# Patient Record
Sex: Female | Born: 1995 | Race: Black or African American | Hispanic: No | Marital: Single | Smoking: Never smoker
Health system: Southern US, Community
[De-identification: ages and names within clinical notes are randomized; demographics above are authoritative.]

## PROBLEM LIST (undated history)

## (undated) ENCOUNTER — Inpatient Hospital Stay (HOSPITAL_COMMUNITY): Payer: Self-pay

## (undated) DIAGNOSIS — Z789 Other specified health status: Secondary | ICD-10-CM

## (undated) HISTORY — PX: NO PAST SURGERIES: SHX2092

---

## 2016-11-11 ENCOUNTER — Emergency Department (HOSPITAL_BASED_OUTPATIENT_CLINIC_OR_DEPARTMENT_OTHER)
Admission: EM | Admit: 2016-11-11 | Discharge: 2016-11-11 | Disposition: A | Discharge status: Home / Self Care | Payer: Federal, State, Local not specified - PPO | Attending: Emergency Medicine | Admitting: Emergency Medicine

## 2016-11-11 ENCOUNTER — Encounter (HOSPITAL_BASED_OUTPATIENT_CLINIC_OR_DEPARTMENT_OTHER): Payer: Self-pay

## 2016-11-11 DIAGNOSIS — J029 Acute pharyngitis, unspecified: Secondary | ICD-10-CM | POA: Diagnosis present

## 2016-11-11 DIAGNOSIS — J020 Streptococcal pharyngitis: Secondary | ICD-10-CM | POA: Diagnosis not present

## 2016-11-11 LAB — RAPID STREP SCREEN (MED CTR MEBANE ONLY): STREPTOCOCCUS, GROUP A SCREEN (DIRECT): POSITIVE — AB

## 2016-11-11 MED ORDER — HYDROCODONE-ACETAMINOPHEN 7.5-325 MG/15ML PO SOLN
10.0000 mL | Freq: Once | ORAL | Status: AC
  Administered 2016-11-11: 10 mL via ORAL
  Filled 2016-11-11: qty 15

## 2016-11-11 MED ORDER — DEXAMETHASONE 10 MG/ML FOR PEDIATRIC ORAL USE
10.0000 mg | Freq: Once | INTRAMUSCULAR | Status: AC
  Administered 2016-11-11: 10 mg via ORAL
  Filled 2016-11-11: qty 1

## 2016-11-11 MED ORDER — PENICILLIN G BENZATHINE 1200000 UNIT/2ML IM SUSP
1.2000 10*6.[IU] | Freq: Once | INTRAMUSCULAR | Status: AC
  Administered 2016-11-11: 1.2 10*6.[IU] via INTRAMUSCULAR
  Filled 2016-11-11: qty 2

## 2016-11-11 NOTE — ED Provider Notes (Signed)
   [facility] Provider Note: Lowella DellJ. Lane Kalika Smay, MD, FACEP  CSN: [id number] MRN: [id number] ARRIVAL: 11/11/16 at 0111 ROOM: MH02/MH02   CHIEF COMPLAINT  Sore Throat   HISTORY OF PRESENT ILLNESS  Victoria Boyle is a 21 y.o. female with a one-day history of sore throat. She rates her pain as a 7 out of 10, worse with swallowing. She has had associated nasal congestion and anterior cervical lymphadenopathy. She denies cough, fever, chills, nausea, vomiting, diarrhea or abdominal pain. She has not taken anything for her symptoms.   History reviewed. No pertinent past medical history.  History reviewed. No pertinent surgical history.  No family history on file.  Social History  Substance Use Topics  . Smoking status: Never Smoker  . Smokeless tobacco: Never Used  . Alcohol use No    Prior to Admission medications   Not on File    Allergies Patient has no known allergies.   REVIEW OF SYSTEMS  Negative except as noted here or in the History of Present Illness.   PHYSICAL EXAMINATION  Initial Vital Signs Blood pressure (!) 143/97, pulse 77, temperature 98.5 F (36.9 C), temperature source Oral, resp. rate 16, height 5\' 4"  (1.626 m), weight 72.6 kg (160 lb), last menstrual period 10/23/2016, SpO2 100 %.  Examination General: Well-developed, well-nourished female in no acute distress; appearance consistent with age of record HENT: normocephalic; atraumatic; TMs normal; tonsils enlarged without significant erythema and no exudate; no trismus, uvula midline; no stridor Eyes: pupils equal, round and reactive to light; extraocular muscles intact Neck: supple; tender anterior cervical lymphadenopathy Heart: regular rate and rhythm Lungs: clear to auscultation bilaterally Abdomen: soft; nondistended; nontender; no masses or hepatosplenomegaly; bowel sounds present Extremities: No deformity; full range of motion Neurologic: Awake, alert and oriented; motor function  intact in all extremities and symmetric; no facial droop Skin: Warm and dry Psychiatric: Normal mood and affect   RESULTS  Summary of this visit's results, reviewed by myself:   EKG Interpretation  Date/Time:    Ventricular Rate:    PR Interval:    QRS Duration:   QT Interval:    QTC Calculation:   R Axis:     Text Interpretation:        Laboratory Studies: Results for orders placed or performed during the hospital encounter of 11/11/16 (from the past 24 hour(s))  Rapid strep screen     Status: Abnormal   Collection Time: 11/11/16  1:20 AM  Result Value Ref Range   Streptococcus, Group A Screen (Direct) POSITIVE (A) NEGATIVE   Imaging Studies: No results found.  ED COURSE  Nursing notes and initial vitals signs, including pulse oximetry, reviewed.  Vitals:   11/11/16 0119 11/11/16 0120  BP: (!) 143/97   Pulse: 77   Resp: 16   Temp: 98.5 F (36.9 C)   TempSrc: Oral   SpO2: 100%   Weight:  72.6 kg (160 lb)  Height:  5\' 4"  (1.626 m)    PROCEDURES    ED DIAGNOSES     ICD-10-CM   1. Strep pharyngitis J02.0        Estevan Kersh, MD 11/11/16 [phone]

## 2016-11-11 NOTE — ED Triage Notes (Signed)
Reports sore throat and lymph node swelling.

## 2016-12-02 ENCOUNTER — Emergency Department (HOSPITAL_BASED_OUTPATIENT_CLINIC_OR_DEPARTMENT_OTHER)
Admission: EM | Admit: 2016-12-02 | Discharge: 2016-12-02 | Disposition: A | Discharge status: Home / Self Care | Payer: Federal, State, Local not specified - PPO | Attending: Emergency Medicine | Admitting: Emergency Medicine

## 2016-12-02 ENCOUNTER — Encounter (HOSPITAL_BASED_OUTPATIENT_CLINIC_OR_DEPARTMENT_OTHER): Payer: Self-pay

## 2016-12-02 DIAGNOSIS — B351 Tinea unguium: Secondary | ICD-10-CM | POA: Insufficient documentation

## 2016-12-02 MED ORDER — BACITRACIN ZINC 500 UNIT/GM EX OINT
TOPICAL_OINTMENT | Freq: Two times a day (BID) | CUTANEOUS | Status: DC
  Administered 2016-12-02: 01:00:00 via TOPICAL
  Filled 2016-12-02: qty 28.35

## 2016-12-02 MED ORDER — MUPIROCIN CALCIUM 2 % EX CREA: 1.0000 "application " | TOPICAL_CREAM | Freq: Two times a day (BID) | CUTANEOUS | 0 refills | Status: AC

## 2016-12-02 NOTE — ED Triage Notes (Signed)
Pt reports right great toe swelling, pain, and redness.

## 2016-12-02 NOTE — ED Notes (Signed)
Unable to discharge pt at the proper time due to computer downtime.

## 2016-12-02 NOTE — ED Provider Notes (Signed)
[facility]   CSN: [id number] Arrival date & time: 12/02/16  0047     History   Chief Complaint Chief Complaint  Patient presents with  . Nail Problem    HPI Victoria Boyle is a 21 y.o. female.  The history is provided by the patient.  Foot Pain  This is a chronic problem. The current episode started more than 1 week ago. The problem occurs constantly. The problem has not changed since onset.Pertinent negatives include no chest pain, no abdominal pain, no headaches and no shortness of breath. Nothing aggravates the symptoms. Nothing relieves the symptoms. She has tried nothing for the symptoms. The treatment provided no relief.  for months has had a problem with right great toe.  Lost nail following a fungus of nail she developed that she thinks was related to pedicure.  Sees a white spot at the corner of the nail cuticle  History reviewed. No pertinent past medical history.  There are no active problems to display for this patient.   History reviewed. No pertinent surgical history.  OB History    No data available       Home Medications    Prior to Admission medications   Medication Sig Start Date End Date Taking? Authorizing Provider  mupirocin cream (BACTROBAN) 2 % Apply 1 application topically 2 (two) times daily. 12/02/16   Kanon Colunga, MD    Family History History reviewed. No pertinent family history.  Social History Social History  Substance Use Topics  . Smoking status: Never Smoker  . Smokeless tobacco: Never Used  . Alcohol use No     Allergies   Patient has no known allergies.   Review of Systems Review of Systems  Constitutional: Negative for fever.  Respiratory: Negative for shortness of breath.   Cardiovascular: Negative for chest pain.  Gastrointestinal: Negative for abdominal pain.  Skin: Negative for color change.  Neurological: Negative for headaches.  All other systems reviewed and are  negative.    Physical Exam Updated Vital Signs BP 132/85 (BP Location: Right Arm)   Pulse 71   Ht 5\' 4"  (1.626 m)   Wt 72.6 kg (160 lb)   LMP 11/22/2016   SpO2 100%   BMI 27.46 kg/m   Physical Exam  Constitutional: She is oriented to person, place, and time. She appears well-developed and well-nourished.  HENT:  Head: Normocephalic and atraumatic.  Mouth/Throat: No oropharyngeal exudate.  Eyes: Pupils are equal, round, and reactive to light. Conjunctivae are normal.  Neck: Normal range of motion. Neck supple.  Cardiovascular: Normal rate, regular rhythm, normal heart sounds and intact distal pulses.   Pulmonary/Chest: Effort normal and breath sounds normal.  Abdominal: Soft. Bowel sounds are normal. There is no tenderness.  Musculoskeletal: Normal range of motion.       Feet:  Neurological: She is alert and oriented to person, place, and time.  Skin: Skin is warm and dry. Capillary refill takes less than 2 seconds.  Psychiatric: She has a normal mood and affect.     ED Treatments / Results   Vitals:   12/02/16 0101 12/02/16 0102  BP:  132/85  Pulse: 71   SpO2: 100%     Procedures Procedures (including critical care time)  Medications Ordered in ED Medications  bacitracin ointment ( Topical Given 12/02/16 0127)      Final Clinical Impressions(s) / ED Diagnoses   Final diagnoses:  Nail fungal infection  there is no paronychia. Antibiotic ointment to the  area and will need to be seen by a foot specialist as I suspect the nailbed itself is damage but not acutely.  Moreover the nail has a fungal infection and is not growing in properly.  Patient verbalizes understanding and agrees to follow up  The patient is very well appearing and has been observed in the ED.  Strict return precautions given for  Purulent drainage from site, redness or blackness of the skin, fevers, changes in vision or thinking, chest pain, dyspnea on exertion, weakness or numbness or any  concerns. No signs of systemic illness or infection. The patient is nontoxic-appearing on exam and vital signs are within normal limits.    I have reviewed the triage vital signs and the nursing notes. Pertinent labs &imaging results that were available during my care of the patient were reviewed by me and considered in my medical decision making (see chart for details).  After history, exam, and medical workup I feel the patient has been appropriately medically screened and is safe for discharge home. Pertinent diagnoses were discussed with the patient. Patient was given return precautions.   New Prescriptions Discharge Medication List as of 12/02/2016  1:23 AM    START taking these medications   Details  mupirocin cream (BACTROBAN) 2 % Apply 1 application topically 2 (two) times daily., Starting Thu 12/02/2016, Print         Tsuruko Murtha, MD 12/02/16 [phone]

## 2016-12-07 ENCOUNTER — Ambulatory Visit (INDEPENDENT_AMBULATORY_CARE_PROVIDER_SITE_OTHER): Payer: Federal, State, Local not specified - PPO | Admitting: Podiatry

## 2016-12-07 ENCOUNTER — Encounter: Payer: Self-pay | Admitting: Podiatry

## 2016-12-07 DIAGNOSIS — L03031 Cellulitis of right toe: Secondary | ICD-10-CM | POA: Diagnosis not present

## 2016-12-07 DIAGNOSIS — B351 Tinea unguium: Secondary | ICD-10-CM

## 2016-12-07 NOTE — Progress Notes (Signed)
   Subjective:    Patient ID: Victoria Boyle, female    DOB: [birth year], 21 y.o.   MRN: [id number]  HPI  21 year old female presents the office today for concerns of right big toenail pain as well as infection the toenail. Genesis his nails are discolored and oddly shaped. She's had some drainage, pus coming from the toenail the right side. Chest assistive a nails are also discolored and she does not really look they're somewhat thickened but she has no pain or drainage or any redness to the areas. She states that she is currently on oral antibiotics. Review of Systems  All other systems reviewed and are negative.      Objective:   Physical Exam General: AAO x3, NAD  Dermatological: On the right hallux toenail there is incurvation of both the medial and lateral aspects the nail corner and is hypergranulation tissue present. Small amount of drainage expressed the nail corner. The nail itself is very dystrophic, discolored with brown discoloration. Remaining toenails are very dystrophic, discolored with brown discoloration as well. No open lesions are identified.  Vascular: Dorsalis Pedis artery and Posterior Tibial artery pedal pulses are 2/4 bilateral with immedate capillary fill time. There is no pain with calf compression, swelling, warmth, erythema.   Neruologic: Grossly intact via light touch bilateral. Protective threshold with Semmes Wienstein monofilament intact to all pedal sites bilateral.   Musculoskeletal: No gross boney pedal deformities bilateral. No pain, crepitus, or limitation noted with foot and ankle range of motion bilateral. Muscular strength 5/5 in all groups tested bilateral.  Gait: Unassisted, Nonantalgic.      Assessment & Plan:  21 year old female with paronychia right hallux; likely onychomycosis -Treatment options discussed including all alternatives, risks, and complications -Etiology of symptoms were discussed -At this time, recommended total nail removal  without chemical matricectomy to the right hallux due to infection. Risks and complications were discussed with the patient for which they understand and  verbally consent to the procedure. Under sterile conditions a total of 3 mL of a mixture of 2% lidocaine plain and 0.5% Marcaine plain was infiltrated in a hallux block fashion. Once anesthetized, the skin was prepped in sterile fashion. A tourniquet was then applied. Next the hallux border was excised making sure to remove the entire offending nail border. Once the nail was  Removed, the area was debrided and the underlying skin was intact. The area was irrigated and hemostasis was obtained.  A dry sterile dressing was applied. After application of the dressing the tourniquet was removed and there is found to be an immediate capillary refill time to the digit. The patient tolerated the procedure well any complications. Post procedure instructions were discussed the patient for which he verbally understood. Follow-up in one week for nail check or sooner if any problems are to arise. Discussed signs/symptoms of worsening infection and directed to call the office immediately should any occur or go directly to the emergency room. In the meantime, encouraged to call the office with any questions, concerns, changes symptoms. -Nail was sent for culture/pathology to [facility]. Given to Hadley Pen, CMA -Doreatha Martin course of antibiotics   Ovid Curd, DPM

## 2016-12-07 NOTE — Patient Instructions (Signed)

## 2016-12-08 NOTE — Addendum Note (Signed)
Addended by: Hadley Pen R on: 12/08/2016 08:17 AM   Modules accepted: Orders

## 2016-12-09 NOTE — Addendum Note (Signed)
Addended by: Hadley Pen R on: 12/09/2016 08:23 AM   Modules accepted: Orders

## 2016-12-14 ENCOUNTER — Encounter: Payer: Self-pay | Admitting: Podiatry

## 2016-12-14 ENCOUNTER — Ambulatory Visit (INDEPENDENT_AMBULATORY_CARE_PROVIDER_SITE_OTHER): Payer: Self-pay | Admitting: Podiatry

## 2016-12-14 DIAGNOSIS — L03031 Cellulitis of right toe: Secondary | ICD-10-CM

## 2016-12-14 NOTE — Progress Notes (Signed)
Subjective: Victoria Boyle is a 21 y.o.  female returns to office today for follow up evaluation after having right Hallux total temporary nail avulsion performed. Patient has been soaking using epsom salts and applying topical antibiotic covered with bandaid daily. Patient denies fevers, chills, nausea, vomiting. Denies any calf pain, chest pain, SOB.   Objective:  Vitals: Reviewed  General: Well developed, nourished, in no acute distress, alert and oriented x3   Dermatology: Skin is warm, dry and supple bilateral. Left hallux nail bedappears to be clean, dry, with mild granular tissue and surrounding scab. There is no surrounding erythema, edema, drainage/purulence. The remaining nails appear unremarkable at this time. There are no other lesions or other signs of infection present.  Neurovascular status: Intact. No lower extremity swelling; No pain with calf compression bilateral.  Musculoskeletal: Decreased tenderness to palpation of the left hallux nail bed. Muscular strength within normal limits bilateral.   Assesement and Plan: S/p partial nail avulsion, doing well.   -Continue soaking in epsom salts twice a day followed by antibiotic ointment and a band-aid. Can leave uncovered at night. Continue this until completely healed.  -Awaiting Bako results- will likely do Lamisil pending results. Discussed side affects today and the need to do blood work prior. She aggress to this.  -If the area has not healed in 2 weeks, call the office for follow-up appointment, or sooner if any problems arise.  -Monitor for any signs/symptoms of infection. Call the office immediately if any occur or go directly to the emergency room. Call with any questions/concerns.  Ovid Curd, DPM

## 2016-12-24 ENCOUNTER — Telehealth: Payer: Self-pay | Admitting: *Deleted

## 2016-12-24 MED ORDER — NONFORMULARY OR COMPOUNDED ITEM: 11 refills | Status: AC

## 2016-12-24 NOTE — Telephone Encounter (Signed)
Entered in error

## 2016-12-24 NOTE — Telephone Encounter (Addendum)
-----   Message from Vivi BarrackMatthew R Wagoner, DPM sent at 12/15/2016  2:53 PM EDT ----- Negative for fungus. Her nails did look like it. I would recommed at least a topical [facility]. This sample is only from her one nail that we took off but the other nails still look thick and discolored. We could still do lamisil given the looks as long as she knows that the culture is negative for the right hallux toenail and this is not a guarantee.12/24/2016-I informed pt of Dr. Gabriel RungWagoner's review of results and recommendations. Pt states she would liek to use the [facility] topical. [facility] [phone], explained they would call with insurance coverage and delivery details. [facility] to [facility].

## 2017-08-21 ENCOUNTER — Other Ambulatory Visit: Payer: Self-pay

## 2017-08-21 ENCOUNTER — Encounter (HOSPITAL_BASED_OUTPATIENT_CLINIC_OR_DEPARTMENT_OTHER): Payer: Self-pay | Admitting: Emergency Medicine

## 2017-08-21 ENCOUNTER — Inpatient Hospital Stay (HOSPITAL_BASED_OUTPATIENT_CLINIC_OR_DEPARTMENT_OTHER)
Admission: EM | Admit: 2017-08-21 | Discharge: 2017-08-22 | Disposition: A | Discharge status: Other Acute Inpatient Hospital | Payer: Federal, State, Local not specified - PPO | Attending: Obstetrics & Gynecology | Admitting: Obstetrics & Gynecology

## 2017-08-21 DIAGNOSIS — Z3A01 Less than 8 weeks gestation of pregnancy: Secondary | ICD-10-CM | POA: Insufficient documentation

## 2017-08-21 DIAGNOSIS — R109 Unspecified abdominal pain: Secondary | ICD-10-CM | POA: Diagnosis not present

## 2017-08-21 DIAGNOSIS — O468X1 Other antepartum hemorrhage, first trimester: Secondary | ICD-10-CM

## 2017-08-21 DIAGNOSIS — O26899 Other specified pregnancy related conditions, unspecified trimester: Secondary | ICD-10-CM

## 2017-08-21 DIAGNOSIS — O9989 Other specified diseases and conditions complicating pregnancy, childbirth and the puerperium: Secondary | ICD-10-CM | POA: Insufficient documentation

## 2017-08-21 DIAGNOSIS — O418X10 Other specified disorders of amniotic fluid and membranes, first trimester, not applicable or unspecified: Secondary | ICD-10-CM

## 2017-08-21 DIAGNOSIS — R1031 Right lower quadrant pain: Secondary | ICD-10-CM | POA: Diagnosis not present

## 2017-08-21 DIAGNOSIS — Z3491 Encounter for supervision of normal pregnancy, unspecified, first trimester: Secondary | ICD-10-CM

## 2017-08-21 DIAGNOSIS — O26891 Other specified pregnancy related conditions, first trimester: Secondary | ICD-10-CM | POA: Diagnosis not present

## 2017-08-21 HISTORY — DX: Other specified health status: Z78.9

## 2017-08-21 LAB — CBC WITH DIFFERENTIAL/PLATELET
Basophils Absolute: 0 10*3/uL (ref 0.0–0.1)
Basophils Relative: 0 %
EOS PCT: 1 %
Eosinophils Absolute: 0.1 10*3/uL (ref 0.0–0.7)
HCT: 33.5 % — ABNORMAL LOW (ref 36.0–46.0)
HEMOGLOBIN: 11.7 g/dL — AB (ref 12.0–15.0)
Lymphocytes Relative: 25 %
Lymphs Abs: 2.4 10*3/uL (ref 0.7–4.0)
MCH: 29.5 pg (ref 26.0–34.0)
MCHC: 34.9 g/dL (ref 30.0–36.0)
MCV: 84.6 fL (ref 78.0–100.0)
Monocytes Absolute: 0.8 10*3/uL (ref 0.1–1.0)
Monocytes Relative: 9 %
NEUTROS PCT: 65 %
Neutro Abs: 6.2 10*3/uL (ref 1.7–7.7)
PLATELETS: 244 10*3/uL (ref 150–400)
RBC: 3.96 MIL/uL (ref 3.87–5.11)
RDW: 14.9 % (ref 11.5–15.5)
WBC: 9.5 10*3/uL (ref 4.0–10.5)

## 2017-08-21 LAB — URINALYSIS, ROUTINE W REFLEX MICROSCOPIC
BILIRUBIN URINE: NEGATIVE
Glucose, UA: NEGATIVE mg/dL
HGB URINE DIPSTICK: NEGATIVE
KETONES UR: 15 mg/dL — AB
Leukocytes, UA: NEGATIVE
NITRITE: NEGATIVE
Protein, ur: NEGATIVE mg/dL
Specific Gravity, Urine: 1.025 (ref 1.005–1.030)
pH: 6.5 (ref 5.0–8.0)

## 2017-08-21 LAB — LIPASE, BLOOD: Lipase: 36 U/L (ref 11–51)

## 2017-08-21 LAB — COMPREHENSIVE METABOLIC PANEL
ALT: 11 U/L — AB (ref 14–54)
AST: 17 U/L (ref 15–41)
Albumin: 4.1 g/dL (ref 3.5–5.0)
Alkaline Phosphatase: 37 U/L — ABNORMAL LOW (ref 38–126)
Anion gap: 7 (ref 5–15)
BUN: 10 mg/dL (ref 6–20)
CALCIUM: 9.4 mg/dL (ref 8.9–10.3)
CO2: 23 mmol/L (ref 22–32)
CREATININE: 0.63 mg/dL (ref 0.44–1.00)
Chloride: 103 mmol/L (ref 101–111)
Glucose, Bld: 87 mg/dL (ref 65–99)
Potassium: 3.9 mmol/L (ref 3.5–5.1)
Sodium: 133 mmol/L — ABNORMAL LOW (ref 135–145)
Total Bilirubin: 0.2 mg/dL — ABNORMAL LOW (ref 0.3–1.2)
Total Protein: 7.4 g/dL (ref 6.5–8.1)

## 2017-08-21 LAB — PREGNANCY, URINE: PREG TEST UR: POSITIVE — AB

## 2017-08-21 LAB — HCG, QUANTITATIVE, PREGNANCY: hCG, Beta Chain, Quant, S: 66056 m[IU]/mL — ABNORMAL HIGH (ref <5)

## 2017-08-21 MED ORDER — MORPHINE SULFATE (PF) 4 MG/ML IV SOLN
INTRAVENOUS | Status: AC
  Filled 2017-08-21: qty 1

## 2017-08-21 MED ORDER — ONDANSETRON HCL 4 MG/2ML IJ SOLN
4.0000 mg | Freq: Once | INTRAMUSCULAR | Status: AC
  Administered 2017-08-21: 4 mg via INTRAVENOUS
  Filled 2017-08-21: qty 2

## 2017-08-21 MED ORDER — MORPHINE SULFATE (PF) 4 MG/ML IV SOLN: 6.0000 mg | Freq: Once | INTRAVENOUS | Status: DC

## 2017-08-21 MED ORDER — MORPHINE SULFATE (PF) 4 MG/ML IV SOLN
6.0000 mg | Freq: Once | INTRAVENOUS | Status: AC
  Administered 2017-08-21: 6 mg via INTRAVENOUS
  Filled 2017-08-21: qty 2

## 2017-08-21 MED ORDER — MORPHINE SULFATE (PF) 4 MG/ML IV SOLN
4.0000 mg | Freq: Once | INTRAVENOUS | Status: AC
  Administered 2017-08-21: 4 mg via INTRAVENOUS

## 2017-08-21 NOTE — ED Notes (Signed)
Patient is upset stating that the EDP told her that they could not do anything for her here.  She has been in pain for 3 weeks and nothing has been done.  I started an IV and she asked me to take it out before I even insert the IV.

## 2017-08-21 NOTE — ED Triage Notes (Signed)
Patient states that she went to [facility] and was told that she "was having an ectopic pregnancy"  But was told to wait 1 week and follow up with her OB- she went to her OB as was told "I have blood on my uterus"  - the patient states that the pain that she is having is unbearable and she is out of work and stomach is bloated. The patient states that she is nauseated but denies any VOmiting and she is lightheaded. States that she needs "stronger pain medications and to stay out of work"

## 2017-08-21 NOTE — ED Notes (Signed)
ED Provider at bedside. 

## 2017-08-21 NOTE — ED Provider Notes (Signed)
[facility] EMERGENCY DEPARTMENT Provider Note   CSN: [id number] Arrival date & time: 08/21/17  1804     History   Chief Complaint Chief Complaint  Patient presents with  . Abdominal Pain    HPI Victoria Boyle is a 22 y.o. female.   Abdominal Pain   This is a new problem. The current episode started more than 1 week ago. The problem occurs constantly. The problem has been gradually worsening. The pain is located in the RLQ, RUQ, LLQ and LUQ (RLQ > all others). The pain is mild. Nothing aggravates the symptoms. Nothing relieves the symptoms. Past workup does not include GI consult.    History reviewed. No pertinent past medical history.  There are no active problems to display for this patient.   History reviewed. No pertinent surgical history.   OB History   None      Home Medications    Prior to Admission medications   Medication Sig Start Date End Date Taking? Authorizing Provider  mupirocin cream (BACTROBAN) 2 % Apply 1 application topically 2 (two) times daily. 12/02/16   Palumbo, April, MD  NONFORMULARY OR COMPOUNDED ITEM [facility]:  Onychomycosis Nail Lacquer - Fluconazole 2%, Terbinafine 1%, DMSO, apply to affected area daily. 12/24/16   Vivi Barrack, DPM    Family History History reviewed. No pertinent family history.  Social History Social History   Tobacco Use  . Smoking status: Never Smoker  . Smokeless tobacco: Never Used  Substance Use Topics  . Alcohol use: No  . Drug use: No     Allergies   Patient has no known allergies.   Review of Systems Review of Systems  Gastrointestinal: Positive for abdominal pain.  All other systems reviewed and are negative.    Physical Exam Updated Vital Signs BP 119/67 (BP Location: Right Arm)   Pulse 61   Temp 97.8 F (36.6 C) (Oral)   Resp 16   Ht  (1.626 m)   Wt 74.4 kg (164 lb)   SpO2 100%   BMI 28.15 kg/m   Physical Exam  Constitutional: She appears  well-developed and well-nourished.  HENT:  Head: Normocephalic and atraumatic.  Eyes: Pupils are equal, round, and reactive to light. EOM are normal.  Neck: Normal range of motion.  Cardiovascular: Normal rate and regular rhythm.  Pulmonary/Chest: No stridor. No respiratory distress.  Abdominal: She exhibits no distension. There is tenderness in the right lower quadrant. There is no rebound and no CVA tenderness.  Neurological: She is alert.  Skin: Skin is warm and dry.  Nursing note and vitals reviewed.    ED Treatments / Results  Labs (all labs ordered are listed, but only abnormal results are displayed) Labs Reviewed  URINALYSIS, ROUTINE W REFLEX MICROSCOPIC - Abnormal; Notable for the following components:      Result Value   Ketones, ur 15 (*)    All other components within normal limits  CBC WITH DIFFERENTIAL/PLATELET - Abnormal; Notable for the following components:   Hemoglobin 11.7 (*)    HCT 33.5 (*)    All other components within normal limits  COMPREHENSIVE METABOLIC PANEL - Abnormal; Notable for the following components:   Sodium 133 (*)    ALT 11 (*)    Alkaline Phosphatase 37 (*)    Total Bilirubin 0.2 (*)    All other components within normal limits  PREGNANCY, URINE - Abnormal; Notable for the following components:   Preg Test, Ur POSITIVE (*)  All other components within normal limits  HCG, QUANTITATIVE, PREGNANCY - Abnormal; Notable for the following components:   hCG, Beta Chain, Quant, S 66,056 (*)    All other components within normal limits  LIPASE, BLOOD    EKG None  Radiology No results found.  Procedures Procedures (including critical care time)  Medications Ordered in ED Medications  morphine 4 MG/ML injection 6 mg (6 mg Intravenous Given 08/21/17 2037)  ondansetron (ZOFRAN) injection 4 mg (4 mg Intravenous Given 08/21/17 2037)  morphine 4 MG/ML injection 4 mg (4 mg Intravenous Given 08/21/17 2341)     Initial Impression / Assessment  and Plan / ED Course  I have reviewed the triage vital signs and the nursing notes.  Pertinent labs & imaging results that were available during my care of the patient were reviewed by me and considered in my medical decision making (see chart for details).    Suspect patient has retained products from her miscarriage however has not had those come out even in light of decreasing HCG's. Worsening pain and abdominal distension. RLQ is worse than others, so if hcg low enough will eval for appendicitis with CT scan as well. otherwise symptom control at this time.  hcg on 4/21 was 1900, 4/23 was 1700. Today 66,000. Concern for heterotopic vs ectopic vs torsion. Needs [place], pain not well controlled without IV narcotics. Discussed with Dr. Debroah Loop at [facility] hospital who agrees to transfer to [facility] for emergent [place] to rule out ruptured ectopic. Patient ok with plan.   Final Clinical Impressions(s) / ED Diagnoses   Final diagnoses:  Right lower quadrant abdominal pain    ED Discharge Orders    None       Tekoa Hamor, Barbara Cower, MD 08/22/17 [phone]

## 2017-08-22 ENCOUNTER — Inpatient Hospital Stay (HOSPITAL_COMMUNITY): Payer: Federal, State, Local not specified - PPO

## 2017-08-22 ENCOUNTER — Encounter (HOSPITAL_COMMUNITY): Payer: Self-pay

## 2017-08-22 DIAGNOSIS — O26891 Other specified pregnancy related conditions, first trimester: Secondary | ICD-10-CM | POA: Diagnosis not present

## 2017-08-22 DIAGNOSIS — R1031 Right lower quadrant pain: Secondary | ICD-10-CM | POA: Diagnosis present

## 2017-08-22 DIAGNOSIS — O9989 Other specified diseases and conditions complicating pregnancy, childbirth and the puerperium: Secondary | ICD-10-CM | POA: Diagnosis not present

## 2017-08-22 DIAGNOSIS — R109 Unspecified abdominal pain: Secondary | ICD-10-CM

## 2017-08-22 DIAGNOSIS — Z3A01 Less than 8 weeks gestation of pregnancy: Secondary | ICD-10-CM

## 2017-08-22 NOTE — ED Notes (Signed)
Contacted OB (Dr. Debroah Loop) at Dr. Danielle Rankin request.

## 2017-08-22 NOTE — MAU Provider Note (Addendum)
History     CSN: [id number]  Arrival date and time: 08/21/17 1804  Chief Complaint  Patient presents with  . Abdominal Pain   HPI Victoria Boyle is a 22 y.o. G1P0 at [redacted]w[redacted]d who presents from [facility] for evaluation for possible ectopic pregnancy. She reports lower abdominal pain that has been ongoing for 3 weeks. She denies any bleeding or discharge. She reports an ultrasound 3 weeks ago in [facility] that did not show anything.   OB History    Gravida  1   Para      Term      Preterm      AB      Living        SAB      TAB      Ectopic      Multiple      Live Births              History reviewed. No pertinent past medical history.  History reviewed. No pertinent surgical history.  History reviewed. No pertinent family history.  Social History   Tobacco Use  . Smoking status: Never Smoker  . Smokeless tobacco: Never Used  Substance Use Topics  . Alcohol use: No  . Drug use: No    Allergies: No Known Allergies  Medications Prior to Admission  Medication Sig Dispense Refill Last Dose  . mupirocin cream (BACTROBAN) 2 % Apply 1 application topically 2 (two) times daily. 15 g 0   . NONFORMULARY OR COMPOUNDED ITEM [facility]:  Onychomycosis Nail Lacquer - Fluconazole 2%, Terbinafine 1%, DMSO, apply to affected area daily. 120 each 11     Review of Systems  Constitutional: Negative.  Negative for fatigue and fever.  HENT: Negative.   Respiratory: Negative.  Negative for shortness of breath.   Cardiovascular: Negative.  Negative for chest pain.  Gastrointestinal: Positive for abdominal pain. Negative for constipation, diarrhea, nausea and vomiting.  Genitourinary: Negative.  Negative for dysuria, vaginal bleeding and vaginal discharge.  Neurological: Negative.  Negative for dizziness and headaches.   Physical Exam   Blood pressure 119/69, pulse 69, temperature 98 F (36.7 C), resp. rate 17, height  (1.626 m), weight 164 lb  (74.4 kg), last menstrual period 07/05/2017, SpO2 100 %.  Physical Exam  Nursing note and vitals reviewed. Constitutional: She is oriented to person, place, and time. She appears well-developed and well-nourished. No distress.  HENT:  Head: Normocephalic.  Eyes: Pupils are equal, round, and reactive to light.  Cardiovascular: Normal rate, regular rhythm and normal heart sounds.  Respiratory: Effort normal and breath sounds normal. No respiratory distress.  GI: Soft. Bowel sounds are normal. She exhibits no distension. There is tenderness in the right lower quadrant, suprapubic area and left lower quadrant. There is no rigidity and no guarding.  Neurological: She is alert and oriented to person, place, and time.  Skin: Skin is warm and dry.  Psychiatric: She has a normal mood and affect. Her behavior is normal. Judgment and thought content normal.    MAU Course  Procedures Results for orders placed or performed during the hospital encounter of 08/21/17 (from the past 24 hour(s))  Urinalysis, Routine w reflex microscopic     Status: Abnormal   Collection Time: 08/21/17  6:42 PM  Result Value Ref Range   Color, Urine YELLOW YELLOW   APPearance CLEAR CLEAR   Specific Gravity, Urine 1.025 1.005 - 1.030   pH 6.5 5.0 - 8.0  Glucose, UA NEGATIVE NEGATIVE mg/dL   Hgb urine dipstick NEGATIVE NEGATIVE   Bilirubin Urine NEGATIVE NEGATIVE   Ketones, ur 15 (A) NEGATIVE mg/dL   Protein, ur NEGATIVE NEGATIVE mg/dL   Nitrite NEGATIVE NEGATIVE   Leukocytes, UA NEGATIVE NEGATIVE  Pregnancy, urine     Status: Abnormal   Collection Time: 08/21/17  6:42 PM  Result Value Ref Range   Preg Test, Ur POSITIVE (A) NEGATIVE  CBC with Differential     Status: Abnormal   Collection Time: 08/21/17  8:29 PM  Result Value Ref Range   WBC 9.5 4.0 - 10.5 K/uL   RBC 3.96 3.87 - 5.11 MIL/uL   Hemoglobin 11.7 (L) 12.0 - 15.0 g/dL   HCT 16.1 (L) 09.6 - 04.5 %   MCV 84.6 78.0 - 100.0 fL   MCH 29.5 26.0 - 34.0  pg   MCHC 34.9 30.0 - 36.0 g/dL   RDW 40.9 81.1 - 91.4 %   Platelets 244 150 - 400 K/uL   Neutrophils Relative % 65 %   Neutro Abs 6.2 1.7 - 7.7 K/uL   Lymphocytes Relative 25 %   Lymphs Abs 2.4 0.7 - 4.0 K/uL   Monocytes Relative 9 %   Monocytes Absolute 0.8 0.1 - 1.0 K/uL   Eosinophils Relative 1 %   Eosinophils Absolute 0.1 0.0 - 0.7 K/uL   Basophils Relative 0 %   Basophils Absolute 0.0 0.0 - 0.1 K/uL  Comprehensive metabolic panel     Status: Abnormal   Collection Time: 08/21/17  8:29 PM  Result Value Ref Range   Sodium 133 (L) 135 - 145 mmol/L   Potassium 3.9 3.5 - 5.1 mmol/L   Chloride 103 101 - 111 mmol/L   CO2 23 22 - 32 mmol/L   Glucose, Bld 87 65 - 99 mg/dL   BUN 10 6 - 20 mg/dL   Creatinine, Ser 7.82 0.44 - 1.00 mg/dL   Calcium 9.4 8.9 - 95.6 mg/dL   Total Protein 7.4 6.5 - 8.1 g/dL   Albumin 4.1 3.5 - 5.0 g/dL   AST 17 15 - 41 U/L   ALT 11 (L) 14 - 54 U/L   Alkaline Phosphatase 37 (L) 38 - 126 U/L   Total Bilirubin 0.2 (L) 0.3 - 1.2 mg/dL   GFR calc non Af Amer >60 >60 mL/min   GFR calc Af Amer >60 >60 mL/min   Anion gap 7 5 - 15  Lipase, blood     Status: None   Collection Time: 08/21/17  8:29 PM  Result Value Ref Range   Lipase 36 11 - 51 U/L  hCG, quantitative, pregnancy     Status: Abnormal   Collection Time: 08/21/17  8:29 PM  Result Value Ref Range   hCG, Beta Chain, Quant, S 66,056 (H) <5 mIU/mL   US Ob Less Than 14 Weeks With Ob Transvaginal  Result Date: 08/22/2017 CLINICAL DATA:  22 year old pregnant female with abdominal pain. LMP: 07/05/2017 corresponding to an estimated gestational age of [redacted] weeks, 6 days. EXAM: OBSTETRIC <14 WK [place] AND TRANSVAGINAL OB US TECHNIQUE: Both transabdominal and transvaginal ultrasound examinations were performed for complete evaluation of the gestation as well as the maternal uterus, adnexal regions, and pelvic cul-de-sac. Transvaginal technique was performed to assess early pregnancy. COMPARISON:  None. FINDINGS:  Intrauterine gestational sac: Single intrauterine gestational sac. Yolk sac:  Seen Embryo:  Present Cardiac Activity: Detected Heart Rate: 122 bpm CRL:  8 mm   6 w   5  d                  [place] EDC: 04/12/2018 Subchorionic hemorrhage: There is a moderate size subchorionic hemorrhage measuring 2.7 x 1.5 x 1.1 cm lateral to the gestational sac and encompassing more than 25% of the circumference of the gestational sac. Maternal uterus/adnexae: The maternal ovaries are unremarkable. A corpus luteum is noted in the right ovary. Small amount of free fluid noted within the pelvis. IMPRESSION: 1. Single live intrauterine pregnancy with an estimated gestational age of [redacted] weeks, 5 days based on today's crown-rump length. 2. Moderate subchorionic hemorrhage.  Follow-up recommended. Electronically Signed   By: Elgie Collard M.D.   On: 08/22/2017 03:14    MDM US OB Comp Less 14 weeks with Transvaginal  Assessment and Plan   1. Normal intrauterine pregnancy on prenatal ultrasound in first trimester   2. Abdominal pain affecting pregnancy   3. [redacted] weeks gestation of pregnancy   4. Subchorionic hemorrhage of placenta in first trimester, single or unspecified fetus    -Discharge home in stable condition -First trimester precautions discussed -Patient advised to follow-up with OB of choice to start prenatal care -Patient may return to MAU as needed or if her condition were to change or worsen  Rolm Bookbinder CNM 08/22/2017, 1:55 AM

## 2017-08-22 NOTE — ED Notes (Signed)
Contacted [facility] for patient to MAU @ [facility]

## 2017-08-22 NOTE — MAU Note (Signed)
Pt transferred from [facility] States she was at [facility] and was told she had ectopic pregnancy. States she followed up with Dr. Shawnie Pons and was told she "had bleeding on uterus and was miscarrying". States she was told to follow up in 2 weeks. States the pain is worse now, mostly on right side. Denies bleeding.

## 2017-08-22 NOTE — Discharge Instructions (Signed)
[facility]  ° ° °[facility]       Phone: [phone] ° °[facility] Phone: [phone] ° °[facility] [place]  Phone: [phone] ° °[facility] [place]  Phone: [phone] ° °[facility] [place]  Phone: [phone] ° °[facility]       Phone: [phone] ° °[facility]    Phone: [phone]  ° °[facility] ([place]    Phone: [phone] ° °[facility]    Phone: [phone] ° °[facility]    Phone: [phone] ° °[facility]    Phone: [phone] ° °[facility]       Phone: [phone]  ° °[facility]  Phone: [phone] ° °[facility]    Phone: [phone] ° °[facility]   Phone: [phone] ° °[facility]      Phone: [phone] ° °[facility]    Phone: [phone] ° °Safe Medications in Pregnancy  ° °Acne: °Benzoyl Peroxide °Salicylic Acid ° °Backache/Headache: °Tylenol: 2 regular strength every 4 hours OR °             2 Extra strength every 6 hours ° °Colds/Coughs/Allergies: °Benadryl (alcohol free) 25 mg every 6 hours as needed °Breath right strips °Claritin °Cepacol throat lozenges °Chloraseptic throat spray °Cold-Eeze- up to three times per day °Cough drops, alcohol free °Flonase (by prescription only) °Guaifenesin °Mucinex °Robitussin DM (plain only, alcohol free) °Saline nasal spray/drops °Sudafed (pseudoephedrine) & Actifed ** use only after [redacted] weeks gestation and if you do not have high blood pressure °Tylenol °Vicks Vaporub °Zinc lozenges °Zyrtec  ° °Constipation: °Colace °Ducolax suppositories °Fleet enema °Glycerin suppositories °Metamucil °Milk of  magnesia °Miralax °Senokot °Smooth move tea ° °Diarrhea: °Kaopectate °Imodium A-D ° °*NO pepto Bismol ° °Hemorrhoids: °Anusol °Anusol HC °Preparation H °Tucks ° °Indigestion: °Tums °Maalox °Mylanta °Zantac  °Pepcid ° °Insomnia: °Benadryl (alcohol free) 25mg every 6 hours as needed °Tylenol PM °Unisom, no Gelcaps ° °Leg Cramps: °Tums °MagGel ° °Nausea/Vomiting:  °Bonine °Dramamine °Emetrol °Ginger extract °Sea bands °Meclizine  °Nausea medication to take during pregnancy:  °Unisom (doxylamine succinate 25 mg tablets) Take one tablet daily at bedtime. If symptoms are not adequately controlled, the dose can be increased to a maximum recommended dose of two tablets daily (1/2 tablet in the morning, 1/2 tablet mid-afternoon and one at bedtime). °Vitamin B6 100mg tablets. Take one tablet twice a day (up to 200 mg per day). ° °Skin Rashes: °Aveeno products °Benadryl cream or 25mg every 6 hours as needed °Calamine Lotion °1% cortisone cream ° °Yeast infection: °Gyne-lotrimin 7 °Monistat 7 ° ° °**If taking multiple medications, please check labels to avoid duplicating the same active ingredients °**take medication as directed on the label °** Do not exceed 4000 mg of tylenol in 24 hours °**Do not take medications that contain aspirin or ibuprofen ° ° ° ° °

## 2018-07-04 ENCOUNTER — Encounter (HOSPITAL_COMMUNITY): Payer: Self-pay

## 2018-09-26 IMAGING — US US OB < 14 WEEKS - US OB TV
1 series · 15 of 28 positions shown · non-contrast
Comparison: None.

CLINICAL DATA: 22-year-old pregnant female with abdominal pain.
LMP: 07/05/2017 corresponding to an estimated gestational age of 6
weeks, 6 days.

EXAM:
OBSTETRIC <14 WK US AND TRANSVAGINAL OB US
TECHNIQUE: Both transabdominal and transvaginal ultrasound examinations were
performed for complete evaluation of the gestation as well as the
maternal uterus, adnexal regions, and pelvic cul-de-sac.
Transvaginal technique was performed to assess early pregnancy.

[Series 1: us ob < 14 weeks - us ob tv · 15 of 50 slices shown]
[im 1/50]
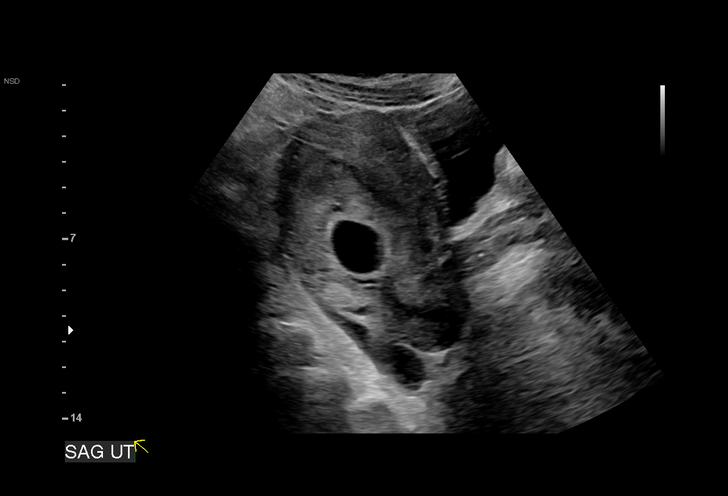
[im 4/50]
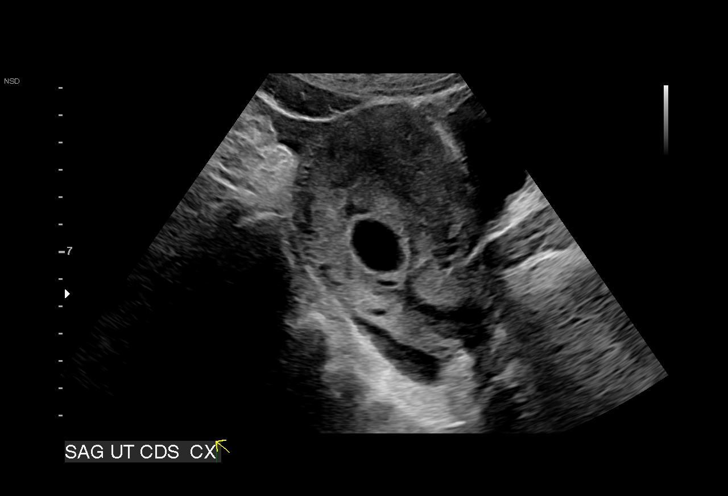
[im 8/50]
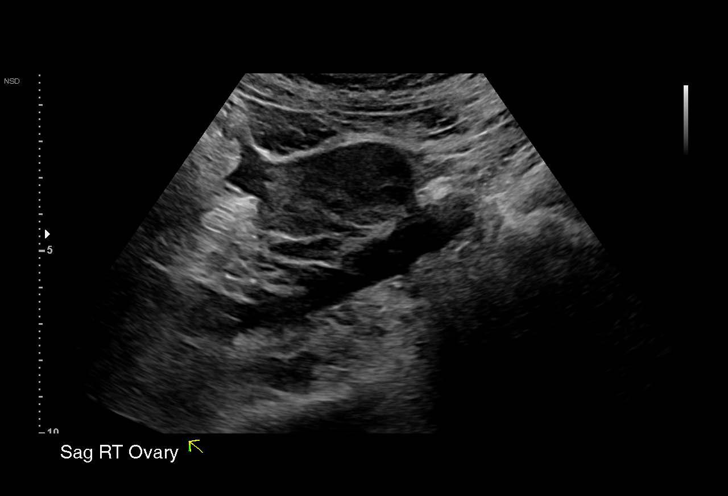
[im 11/50]
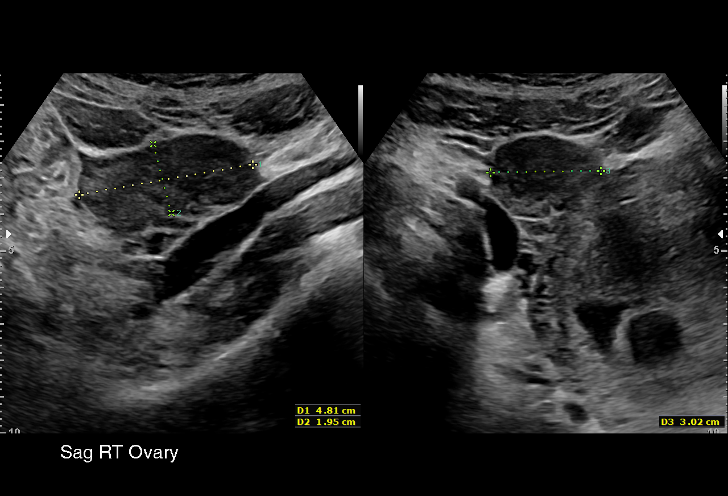
[im 15/50]
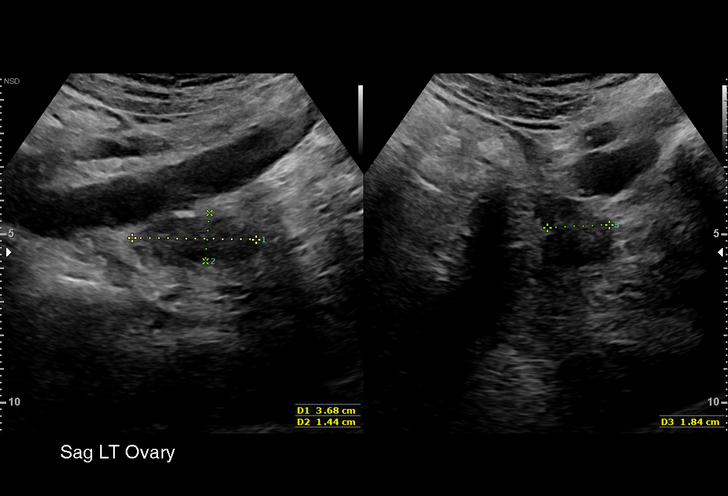
[im 19/50]
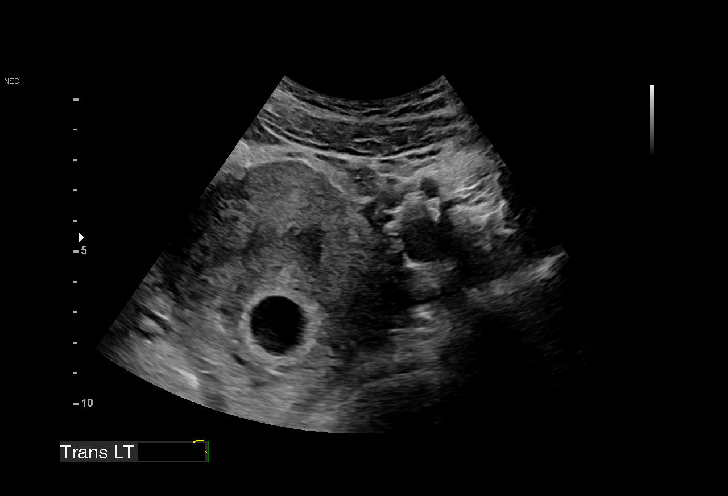
[im 22/50]
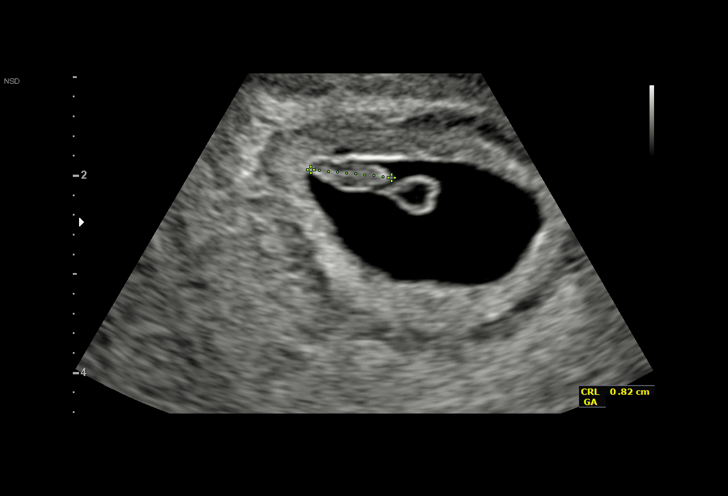
[im 26/50]
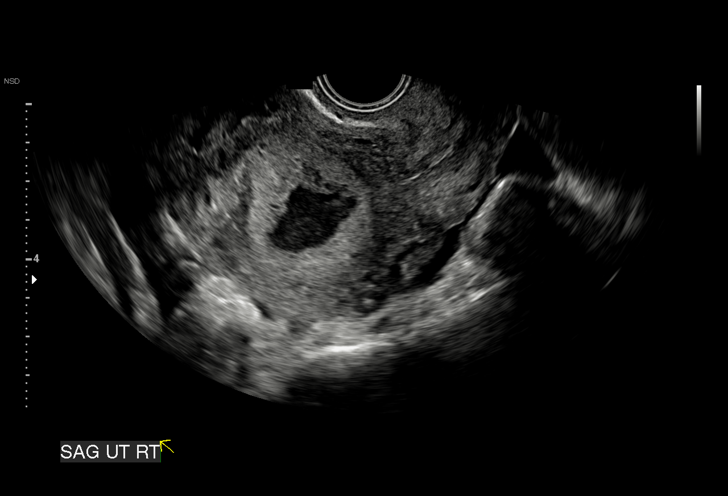
[im 28/50]
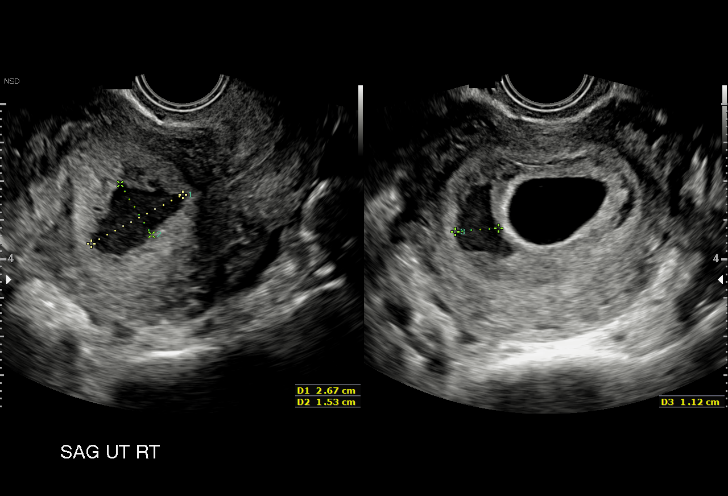
[im 31/50]
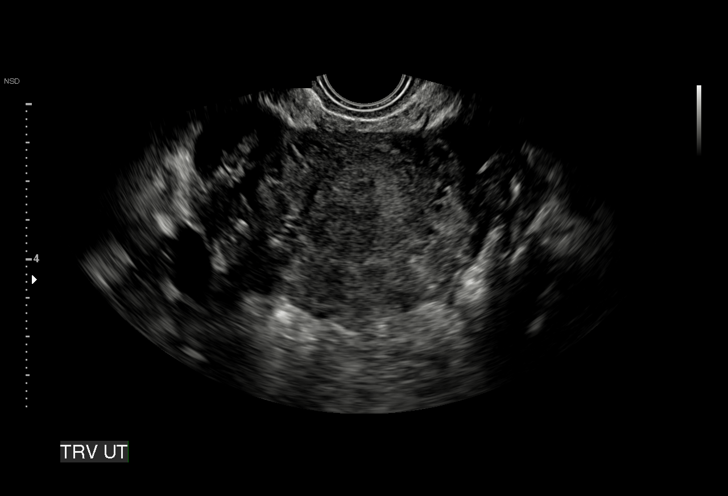
[im 35/50]
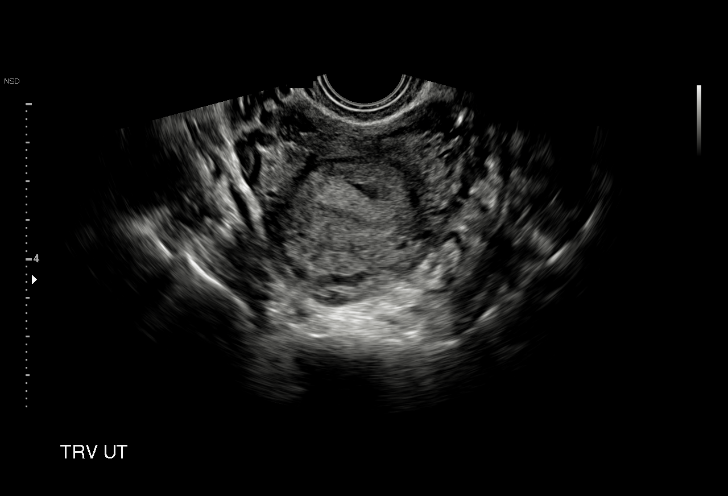
[im 39/50]
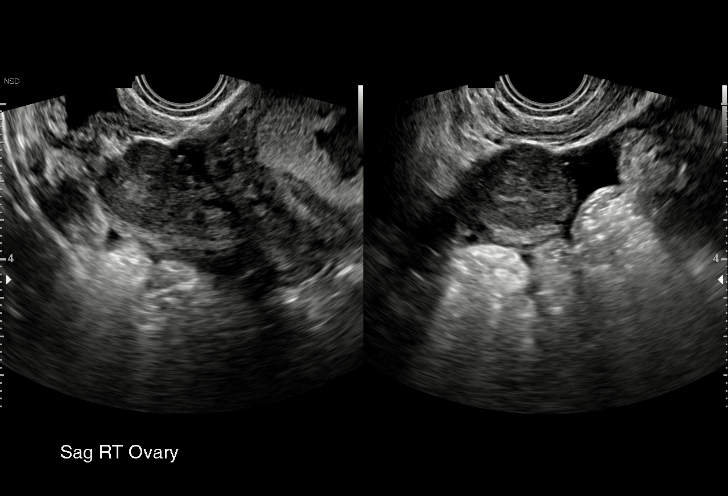
[im 42/50]
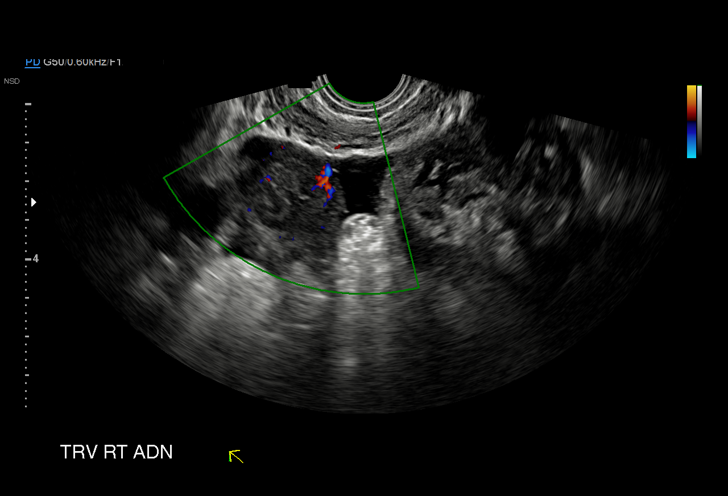
[im 46/50]
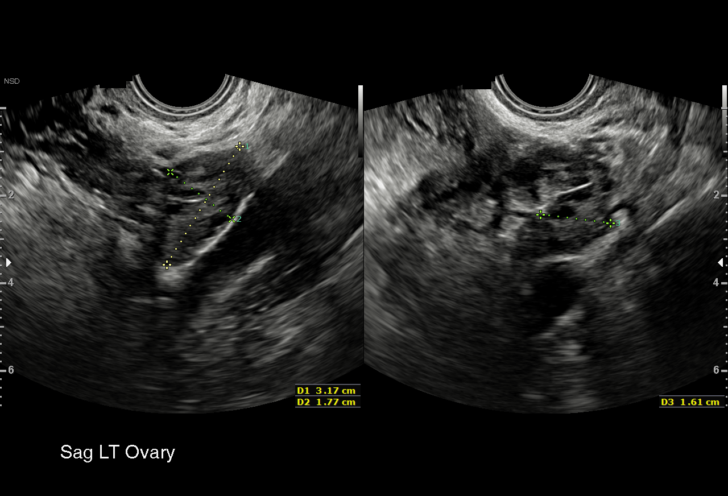
[im 50/50]
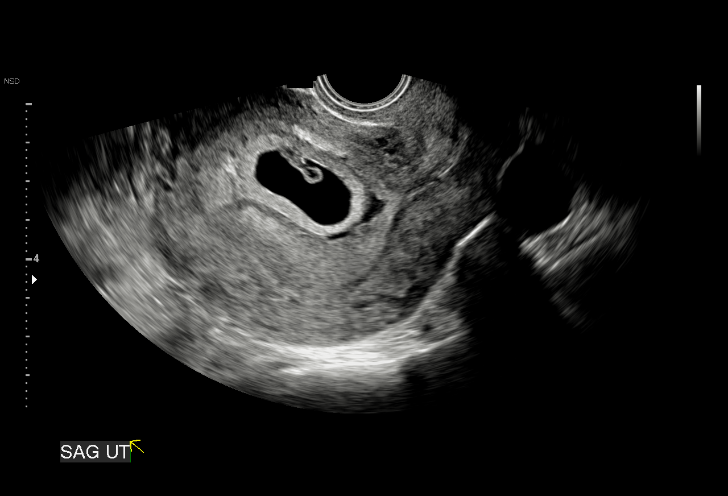

[15 of 28 positions shown; findings below may reference images not displayed]

FINDINGS: Intrauterine gestational sac: Single intrauterine gestational sac.

Yolk sac:  Seen

Embryo:  Present

Cardiac Activity: Detected

Heart Rate: 122 bpm

CRL:  8 mm   6 w   5 d                  US EDC: 04/12/2018

Subchorionic hemorrhage: There is a moderate size subchorionic
hemorrhage measuring 2.7 x 1.5 x 1.1 cm lateral to the gestational
sac and encompassing more than 25% of the circumference of the
gestational sac.

Maternal uterus/adnexae: The maternal ovaries are unremarkable. A
corpus luteum is noted in the right ovary. Small amount of free
fluid noted within the pelvis.
IMPRESSION: 1. Single live intrauterine pregnancy with an estimated gestational
age of 6 weeks, 5 days based on today's crown-rump length.
2. Moderate subchorionic hemorrhage.  Follow-up recommended.

## 2020-10-31 ENCOUNTER — Other Ambulatory Visit: Payer: Federal, State, Local not specified - PPO
